# Patient Record
Sex: Female | Born: 1985 | Race: Black or African American | Hispanic: No | Marital: Single | Smoking: Never smoker
Health system: Southern US, Community
[De-identification: ages and names within clinical notes are randomized; demographics above are authoritative.]

---

## 2015-12-21 ENCOUNTER — Emergency Department (INDEPENDENT_AMBULATORY_CARE_PROVIDER_SITE_OTHER)
Admission: EM | Admit: 2015-12-21 | Discharge: 2015-12-21 | Disposition: A | Discharge status: Home / Self Care | Payer: Self-pay | Source: Home / Self Care | Attending: Family Medicine | Admitting: Family Medicine

## 2015-12-21 ENCOUNTER — Encounter: Payer: Self-pay | Admitting: Emergency Medicine

## 2015-12-21 DIAGNOSIS — M549 Dorsalgia, unspecified: Secondary | ICD-10-CM

## 2015-12-21 MED ORDER — CYCLOBENZAPRINE HCL 10 MG PO TABS: 10.0000 mg | ORAL_TABLET | Freq: Two times a day (BID) | ORAL | 0 refills | Status: DC | PRN
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
## 2016-07-13 ENCOUNTER — Emergency Department (INDEPENDENT_AMBULATORY_CARE_PROVIDER_SITE_OTHER)
Admission: EM | Admit: 2016-07-13 | Discharge: 2016-07-13 | Disposition: A | Discharge status: Home / Self Care | Payer: 59 | Source: Home / Self Care | Attending: Family Medicine | Admitting: Family Medicine

## 2016-07-13 ENCOUNTER — Emergency Department (INDEPENDENT_AMBULATORY_CARE_PROVIDER_SITE_OTHER): Payer: 59

## 2016-07-13 DIAGNOSIS — R109 Unspecified abdominal pain: Secondary | ICD-10-CM

## 2016-07-13 DIAGNOSIS — K5900 Constipation, unspecified: Secondary | ICD-10-CM

## 2016-07-13 DIAGNOSIS — R1032 Left lower quadrant pain: Secondary | ICD-10-CM

## 2016-07-13 DIAGNOSIS — R3129 Other microscopic hematuria: Secondary | ICD-10-CM

## 2016-07-13 DIAGNOSIS — Z841 Family history of disorders of kidney and ureter: Secondary | ICD-10-CM

## 2016-07-13 LAB — POCT URINALYSIS DIP (MANUAL ENTRY)
Bilirubin, UA: NEGATIVE
Glucose, UA: NEGATIVE mg/dL
Ketones, POC UA: NEGATIVE mg/dL
Leukocytes, UA: NEGATIVE
Nitrite, UA: NEGATIVE
Protein Ur, POC: NEGATIVE mg/dL
Spec Grav, UA: >=1.03 — AB (ref 1.010–1.025)
Urobilinogen, UA: 0.2 E.U./dL
pH, UA: 5.5 (ref 5.0–8.0)

## 2016-07-13 MED ORDER — NAPROXEN 500 MG PO TABS: 500.0000 mg | ORAL_TABLET | Freq: Two times a day (BID) | ORAL | 0 refills | Status: DC

## 2016-07-13 NOTE — ED Provider Notes (Signed)
CSN: [id number]     Arrival date & time 07/13/16  1901 History   First MD Initiated Contact with Patient 07/13/16 1916     Chief Complaint  Patient presents with  . Abdominal Pain   (Consider location/radiation/quality/duration/timing/severity/associated sxs/prior Treatment) HPI Caitlin Kim is a 31 y.o. female presenting to UC with c/o LLQ pain for about 3 days.  Pain is sharp, radiates up into Left flank.  Pain is worse with certain movements but not when sitting still.  She called a triage line yesterday and was told she could be constipated so she tried magnesium citrate. She did have some BMs last night but still has pain.  Denies fever, chills, n/v/d. Denies urinary or vaginal symptoms.  Hx of ovarian cyst when she was pregnant but states it was only seen on ultrasound and never caused her pain.  No hx of diverticulitis or abdominal surgeries. No hx of kidney stones but she states her mother has had them before.  LMP: 06/21/16.  Pt notes she took a urine pregnancy test at home, it was negative. Denies concern for STIs.   No past medical history on file. No past surgical history on file. No family history on file. Social History  Substance Use Topics  . Smoking status: Never Smoker  . Smokeless tobacco: Never Used  . Alcohol use Not on file   OB History    No data available     Review of Systems  Constitutional: Negative for chills and fever.  Gastrointestinal: Positive for abdominal pain (left side, worse in LLQ). Negative for diarrhea, nausea and vomiting.  Genitourinary: Positive for flank pain (Left). Negative for dysuria, frequency, hematuria, pelvic pain, urgency, vaginal bleeding, vaginal discharge and vaginal pain.  Musculoskeletal: Negative for back pain and myalgias.    Allergies  Patient has no known allergies.  Home Medications   Prior to Admission medications   Medication Sig Start Date End Date Taking? Authorizing Provider  cyclobenzaprine (FLEXERIL) 10 MG  tablet Take 1 tablet (10 mg total) by mouth 2 (two) times daily as needed. 12/21/15   Lurene ShadowPhelps, Torsten Weniger O, PA-C  naproxen (NAPROSYN) 500 MG tablet Take 1 tablet (500 mg total) by mouth 2 (two) times daily. 07/13/16   Lurene ShadowPhelps, Ivey Nembhard O, PA-C   Meds Ordered and Administered this Visit  Medications - No data to display  BP 107/67 (BP Location: Left Arm)   Pulse 63   Temp 98.5 F (36.9 C) (Oral)   Resp 16   Ht 5\' 4"  (1.626 m)   Wt 117 lb 8 oz (53.3 kg)   LMP 06/21/2016   SpO2 99%   BMI 20.17 kg/m  No data found.   Physical Exam  Constitutional: She is oriented to person, place, and time. She appears well-developed and well-nourished. No distress.  HENT:  Head: Normocephalic and atraumatic.  Mouth/Throat: Oropharynx is clear and moist.  Eyes: EOM are normal.  Neck: Normal range of motion.  Cardiovascular: Normal rate and regular rhythm.   Pulmonary/Chest: Effort normal and breath sounds normal. No respiratory distress. She has no wheezes. She has no rales.  Abdominal: Soft. She exhibits no distension and no mass. Bowel sounds are decreased. There is tenderness. There is no rebound, no guarding and no CVA tenderness. No hernia.    Musculoskeletal: Normal range of motion.  Neurological: She is alert and oriented to person, place, and time.  Skin: Skin is warm and dry. She is not diaphoretic.  Psychiatric: She has a normal mood and affect.  Her behavior is normal.  Nursing note and vitals reviewed.   Urgent Care Course     Procedures (including critical care time)  Labs Review Labs Reviewed  POCT URINALYSIS DIP (MANUAL ENTRY) - Abnormal; Notable for the following:       Result Value   Spec Grav, UA >=1.030 (*)    Blood, UA moderate (*)    All other components within normal limits  URINE CULTURE    Imaging Review Dg Abdomen 1 View  Result Date: 07/13/2016 CLINICAL DATA:  LEFT-sided abdominal pain most severe in LEFT lower quadrant for 3 days question constipation, have a bowel  movement yesterday EXAM: ABDOMEN - 1 VIEW COMPARISON:  None FINDINGS: Minimally prominent stool in RIGHT colon. Remaining stool burden normal. No bowel dilatation or bowel wall thickening. Osseous structures unremarkable. Small pelvic phleboliths. No definite urinary tract calcification IMPRESSION: Minimally prominent stool in RIGHT colon. Otherwise negative exam. Electronically Signed   By: Ulyses Southward M.D.   On: 07/13/2016 19:35    MDM   1. Left flank pain   2. Abdominal pain, left lower quadrant   3. Hematuria, microscopic   4. Family history of kidney stones     Pt c/o Left sided abdominal pain for about 3 days. Recent constipation. She did have several BMs last night.  KUB: stool noted in Right colon but not in the Left. Likely not source of pt's pain  UA: moderate Hgb  No evidence of surgical abdomen Question if pt has kidney stone. Encouraged good hydration, Rx: Naproxen Strainer provided Home care instructions provided Encouraged to go to ED if symptoms worsen this weekend, may f/u next week for ultrasound if not improving but not worsening.    Lurene Shadow, [place] 07/13/16 2011

## 2016-07-13 NOTE — ED Triage Notes (Signed)
Pt states she's been having LLQ pain for 3 days that sends a sharp pain up her back. Pt states she called a triage line yesterday and was told she may be constipated and try magnesium citrate. States she did this last night and had bowel movements but still had the pain. No fever or chills.

## 2016-07-14 LAB — URINE CULTURE: Organism ID, Bacteria: NO GROWTH

## 2016-07-15 ENCOUNTER — Telehealth: Payer: Self-pay
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
## 2018-03-18 IMAGING — DX DG ABDOMEN 1V
1 series · 1 of 1 positions shown · non-contrast
Comparison: None

CLINICAL DATA: LEFT-sided abdominal pain most severe in LEFT lower
quadrant for 3 days question constipation, have a bowel movement
yesterday

EXAM:
ABDOMEN - 1 VIEW

[abdomen kub]
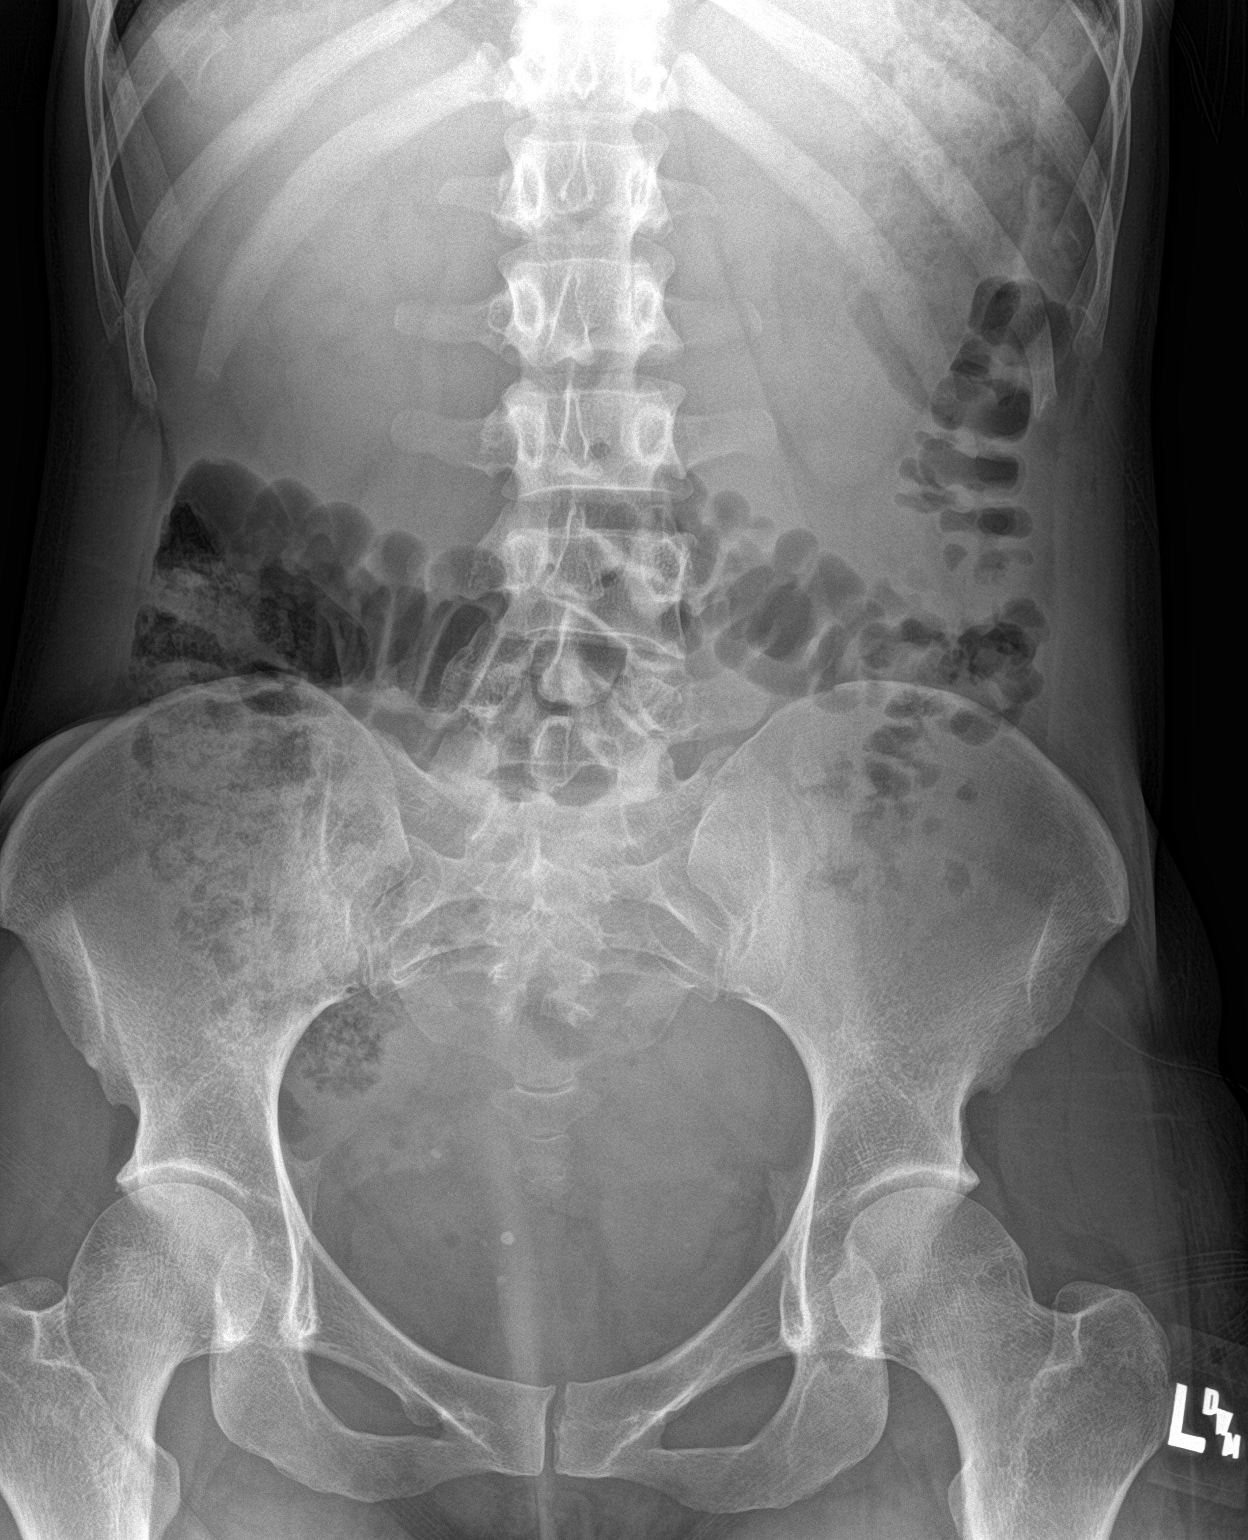

[1 of 1 positions shown; findings below may reference images not displayed]

FINDINGS: Minimally prominent stool in RIGHT colon.

Remaining stool burden normal.

No bowel dilatation or bowel wall thickening.

Osseous structures unremarkable.

Small pelvic phleboliths.

No definite urinary tract calcification
IMPRESSION: Minimally prominent stool in RIGHT colon.

Otherwise negative exam.

## 2018-10-08 ENCOUNTER — Encounter: Payer: Self-pay | Admitting: Emergency Medicine

## 2018-10-08 ENCOUNTER — Emergency Department (INDEPENDENT_AMBULATORY_CARE_PROVIDER_SITE_OTHER)
Admission: EM | Admit: 2018-10-08 | Discharge: 2018-10-08 | Disposition: A | Discharge status: Home / Self Care | Payer: Self-pay | Source: Home / Self Care

## 2018-10-08 ENCOUNTER — Other Ambulatory Visit: Payer: Self-pay

## 2018-10-08 DIAGNOSIS — M549 Dorsalgia, unspecified: Secondary | ICD-10-CM

## 2018-10-08 DIAGNOSIS — Z041 Encounter for examination and observation following transport accident: Secondary | ICD-10-CM
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
## 2018-10-08 NOTE — Discharge Instructions (Signed)
°  You may take 500mg  acetaminophen every 4-6 hours or in combination with ibuprofen 400-600mg  every 6-8 hours as needed for pain and inflammation.  You may also alternate cool and warm compresses the next few days to help with soreness.   Please follow up with sports medicine or family medicine if not improving by next week.

## 2019-06-15 ENCOUNTER — Emergency Department (INDEPENDENT_AMBULATORY_CARE_PROVIDER_SITE_OTHER)
Admission: EM | Admit: 2019-06-15 | Discharge: 2019-06-15 | Disposition: A | Discharge status: Home / Self Care | Payer: BC Managed Care – PPO | Source: Home / Self Care

## 2019-06-15 ENCOUNTER — Other Ambulatory Visit: Payer: Self-pay

## 2019-06-15 DIAGNOSIS — L0100 Impetigo, unspecified: Secondary | ICD-10-CM

## 2019-06-15 DIAGNOSIS — J00 Acute nasopharyngitis [common cold]: Secondary | ICD-10-CM

## 2019-06-15 DIAGNOSIS — R0981 Nasal congestion: Secondary | ICD-10-CM

## 2019-06-15 MED ORDER — IPRATROPIUM BROMIDE 0.06 % NA SOLN: 2.0000 | Freq: Four times a day (QID) | NASAL | 1 refills | Status: AC

## 2019-06-15 MED ORDER — MUPIROCIN 2 % EX OINT: TOPICAL_OINTMENT | CUTANEOUS | 0 refills | Status: AC

## 2019-06-15 NOTE — ED Triage Notes (Signed)
Patient presents to Urgent Care with complaints of head and nasal congestion since 4 days ago. Patient reports her newborn just had influenza and she thinks he may have given it to her. Pt has been taking benadryl and Claritin, denies fevers. Area around nose is red and pt states it is irritated from blowing her nose so frequently, has also been sleeping next to a humidifier.

## 2019-06-15 NOTE — ED Provider Notes (Signed)
[facility]    CSN: [id number] Arrival date & time: 06/15/19  0935      History   Chief Complaint Chief Complaint  Patient presents with  . Nasal Congestion    HPI Caitlin Kim is a 34 y.o. female.   HPI  Caitlin Kim is a 34 y.o. female presenting to UC with c/o 4 days of nasal congestion and irritation of her nose from blowing her nose often. Denies HA, dizziness, sore throat, ear pain, cough, fever, chills. She notes her newborn did just test positive for parainfluenza and wonders if she got sick from him.  She has tried OTC cough/cold and allergy medication w/o relief. Pt does not like nasal sprays, she has not tried any nasal sprays or sinus rinses yet.    History reviewed. No pertinent past medical history.  There are no problems to display for this patient.   History reviewed. No pertinent surgical history.  OB History   No obstetric history on file.      Home Medications    Prior to Admission medications   Medication Sig Start Date End Date Taking? Authorizing Provider  ipratropium (ATROVENT) 0.06 % nasal spray Place 2 sprays into both nostrils 4 (four) times daily. 06/15/19   Lurene Shadow, PA-C  mupirocin ointment (BACTROBAN) 2 % Apply to wound 3 times daily for 5 days 06/15/19   Lurene Shadow, PA-C    Family History Family History  Problem Relation Age of Onset  . Healthy Mother   . Healthy Father     Social History Social History   Tobacco Use  . Smoking status: Never Smoker  . Smokeless tobacco: Never Used  Vaping Use  . Vaping Use: Never used  Substance Use Topics  . Alcohol use: Not Currently  . Drug use: Not on file     Allergies   Patient has no known allergies.   Review of Systems Review of Systems  Constitutional: Negative for chills and fever.  HENT: Positive for congestion and rhinorrhea. Negative for ear pain, sinus pressure, sinus pain, sore throat, trouble swallowing and voice change.   Respiratory:  Negative for cough and shortness of breath.   Cardiovascular: Negative for chest pain and palpitations.  Gastrointestinal: Negative for abdominal pain, diarrhea, nausea and vomiting.  Musculoskeletal: Negative for arthralgias, back pain and myalgias.  Skin: Negative for rash.  Neurological: Negative for dizziness, light-headedness and headaches.  All other systems reviewed and are negative.    Physical Exam Triage Vital Signs ED Triage Vitals  Enc Vitals Group     BP 06/15/19 0954 134/85     Pulse Rate 06/15/19 0954 91     Resp 06/15/19 0954 18     Temp 06/15/19 0954 98.4 F (36.9 C)     Temp Source 06/15/19 0954 Oral     SpO2 06/15/19 0954 98 %     Weight --      Height --      Head Circumference --      Peak Flow --      Pain Score 06/15/19 0951 0     Pain Loc --      Pain Edu? --      Excl. in [place]? --    No data found.  Updated Vital Signs BP 134/85 (BP Location: Left Arm)   Pulse 91   Temp 98.4 F (36.9 C) (Oral)   Resp 18   SpO2 98%   Visual Acuity Right Eye Distance:  Left Eye Distance:   Bilateral Distance:    Right Eye Near:   Left Eye Near:    Bilateral Near:     Physical Exam Vitals and nursing note reviewed.  Constitutional:      General: She is not in acute distress.    Appearance: Normal appearance. She is well-developed. She is not ill-appearing, toxic-appearing or diaphoretic.  HENT:     Head: Normocephalic and atraumatic.     Right Ear: Tympanic membrane and ear canal normal.     Left Ear: Tympanic membrane and ear canal normal.     Nose: Mucosal edema and congestion present.     Right Sinus: No maxillary sinus tenderness or frontal sinus tenderness.     Left Sinus: No maxillary sinus tenderness or frontal sinus tenderness.      Mouth/Throat:     Lips: Pink.     Mouth: Mucous membranes are moist.     Pharynx: Oropharynx is clear. Uvula midline.  Cardiovascular:     Rate and Rhythm: Normal rate and regular rhythm.  Pulmonary:      Effort: Pulmonary effort is normal. No respiratory distress.     Breath sounds: Normal breath sounds. No stridor. No wheezing, rhonchi or rales.  Musculoskeletal:        General: Normal range of motion.     Cervical back: Normal range of motion.  Skin:    General: Skin is warm and dry.  Neurological:     Mental Status: She is alert and oriented to person, place, and time.  Psychiatric:        Behavior: Behavior normal.      UC Treatments / Results  Labs (all labs ordered are listed, but only abnormal results are displayed) Labs Reviewed - No data to display  EKG   Radiology No results found.  Procedures Procedures (including critical care time)  Medications Ordered in UC Medications - No data to display  Initial Impression / Assessment and Plan / UC Course  I have reviewed the triage vital signs and the nursing notes.  Pertinent labs & imaging results that were available during my care of the patient were reviewed by me and considered in my medical decision making (see chart for details).     Hx and exam c/w common cold.  Encouraged symptomatic tx of nasal congestion Mupirocin prescribed for impetigo starting on edge of Left nare.  F/u with PCP  AVS provided  Final Clinical Impressions(s) / UC Diagnoses   Final diagnoses:  Nasal congestion  Common cold  Impetigo     Discharge Instructions      You may take 500mg  acetaminophen every 4-6 hours or in combination with ibuprofen 400-600mg  every 6-8 hours as needed for pain, inflammation, and fever.  Be sure to well hydrated with clear liquids and get at least 8 hours of sleep at night, preferably more while sick.   Please follow up with family medicine in 1 week if needed.     ED Prescriptions    Medication Sig Dispense Auth. Provider   ipratropium (ATROVENT) 0.06 % nasal spray Place 2 sprays into both nostrils 4 (four) times daily. 15 mL Gerarda Fraction, Colter Magowan O, PA-C   mupirocin ointment (BACTROBAN) 2 % Apply  to wound 3 times daily for 5 days 22 g Noe Gens, [place]     PDMP not reviewed this encounter.   Noe Gens, PA-C 06/15/19 1021

## 2019-06-15 NOTE — Discharge Instructions (Signed)
  You may take 500mg acetaminophen every 4-6 hours or in combination with ibuprofen 400-600mg every 6-8 hours as needed for pain, inflammation, and fever.  Be sure to well hydrated with clear liquids and get at least 8 hours of sleep at night, preferably more while sick.   Please follow up with family medicine in 1 week if needed.
